# Patient Record
Sex: Male | Born: 1980 | Race: White | Hispanic: No | Marital: Married | Smoking: Never smoker
Health system: Southern US, Community
[De-identification: ages and names within clinical notes are randomized; demographics above are authoritative.]

## PROBLEM LIST (undated history)

## (undated) DIAGNOSIS — T4145XA Adverse effect of unspecified anesthetic, initial encounter: Secondary | ICD-10-CM

## (undated) DIAGNOSIS — T8859XA Other complications of anesthesia, initial encounter: Secondary | ICD-10-CM

## (undated) DIAGNOSIS — F988 Other specified behavioral and emotional disorders with onset usually occurring in childhood and adolescence: Secondary | ICD-10-CM

## (undated) DIAGNOSIS — I456 Pre-excitation syndrome: Secondary | ICD-10-CM

## (undated) DIAGNOSIS — R112 Nausea with vomiting, unspecified: Secondary | ICD-10-CM

## (undated) DIAGNOSIS — Z9889 Other specified postprocedural states: Secondary | ICD-10-CM

## (undated) HISTORY — PX: HERNIA REPAIR: SHX51

## (undated) HISTORY — PX: KNEE SURGERY: SHX244

## (undated) HISTORY — PX: CARDIAC SURGERY: SHX584

---

## 2008-08-14 ENCOUNTER — Ambulatory Visit: Payer: Self-pay | Admitting: Diagnostic Radiology

## 2008-08-14 ENCOUNTER — Emergency Department (HOSPITAL_BASED_OUTPATIENT_CLINIC_OR_DEPARTMENT_OTHER): Admission: EM | Admit: 2008-08-14 | Discharge: 2008-08-14 | Payer: Self-pay | Admitting: Emergency Medicine

## 2010-02-06 ENCOUNTER — Emergency Department (HOSPITAL_BASED_OUTPATIENT_CLINIC_OR_DEPARTMENT_OTHER): Admission: EM | Admit: 2010-02-06 | Discharge: 2010-02-06 | Payer: Self-pay | Admitting: Emergency Medicine

## 2012-12-20 ENCOUNTER — Emergency Department (INDEPENDENT_AMBULATORY_CARE_PROVIDER_SITE_OTHER): Payer: BC Managed Care – PPO

## 2012-12-20 ENCOUNTER — Encounter: Payer: Self-pay | Admitting: *Deleted

## 2012-12-20 ENCOUNTER — Emergency Department (INDEPENDENT_AMBULATORY_CARE_PROVIDER_SITE_OTHER)
Admission: EM | Admit: 2012-12-20 | Discharge: 2012-12-20 | Disposition: A | Discharge status: Home / Self Care | Payer: BC Managed Care – PPO | Source: Home / Self Care | Attending: Family Medicine | Admitting: Family Medicine

## 2012-12-20 DIAGNOSIS — Z9181 History of falling: Secondary | ICD-10-CM

## 2012-12-20 DIAGNOSIS — R0789 Other chest pain: Secondary | ICD-10-CM

## 2012-12-20 DIAGNOSIS — S20211A Contusion of right front wall of thorax, initial encounter: Secondary | ICD-10-CM

## 2012-12-20 DIAGNOSIS — S20219A Contusion of unspecified front wall of thorax, initial encounter: Secondary | ICD-10-CM

## 2012-12-20 HISTORY — DX: Pre-excitation syndrome: I45.6

## 2012-12-20 HISTORY — DX: Other specified behavioral and emotional disorders with onset usually occurring in childhood and adolescence: F98.8

## 2012-12-20 MED ORDER — CYCLOBENZAPRINE HCL 10 MG PO TABS: ORAL_TABLET | ORAL | Status: DC

## 2012-12-20 NOTE — ED Provider Notes (Signed)
CSN: [id number]     Arrival date & time 12/20/12  1048 History   First MD Initiated Contact with Patient 12/20/12 1150     Chief Complaint  Patient presents with  . Rib Injury      HPI Comments: Patient reports that he was throwing a [place] four days ago.  While jumping to reach the Frisbee he fell, landing on his right side.  He has had persistent pain in his right chest but no shortness of breath  Patient is a 32 y.o. male presenting with chest pain. The history is provided by the patient.  Chest Pain Pain location:  R chest Pain quality: sharp   Pain radiates to:  Does not radiate Pain radiates to the back: no   Pain severity:  Moderate Onset quality:  Sudden Duration:  4 days Timing:  Intermittent Progression:  Unchanged Chronicity:  New Context: trauma   Relieved by: pain medicine. Worsened by:  Coughing, deep breathing and certain positions Associated symptoms: no abdominal pain, no back pain, no cough, no diaphoresis, no fatigue, no fever, no palpitations and no shortness of breath     Past Medical History  Diagnosis Date  . Wolff-Parkinson-White syndrome     age 70  . ADD (attention deficit disorder)    Past Surgical History  Procedure Laterality Date  . Hernia repair    . Cardiac surgery    . Knee surgery Left    History reviewed. No pertinent family history. History  Substance Use Topics  . Smoking status: Never Smoker   . Smokeless tobacco: Never Used  . Alcohol Use: Yes    Review of Systems  Constitutional: Negative for fever, diaphoresis and fatigue.  Respiratory: Negative for cough and shortness of breath.   Cardiovascular: Positive for chest pain. Negative for palpitations.  Gastrointestinal: Negative for abdominal pain.  Musculoskeletal: Negative for back pain.  All other systems reviewed and are negative.    Allergies  Review of patient's allergies indicates no known allergies.  Home Medications   Current Outpatient Rx  Name  Route  Sig   Dispense  Refill  . cyclobenzaprine (FLEXERIL) 10 MG tablet      Take one tab by mouth at bedtime   5 tablet   0    BP 126/84  Pulse 60  Temp(Src) 98.2 F (36.8 C) (Oral)  Resp 18  Wt 225 lb (102.059 kg)  SpO2 100% Physical Exam  Nursing note and vitals reviewed. Constitutional: He is oriented to person, place, and time. He appears well-developed and well-nourished. No distress.  HENT:  Head: Atraumatic.  Eyes: Conjunctivae are normal. Pupils are equal, round, and reactive to light.  Neck: Normal range of motion.  Cardiovascular: Normal heart sounds.   Pulmonary/Chest: Breath sounds normal. He exhibits tenderness and bony tenderness. He exhibits no crepitus and no swelling.    Patient has distinct tenderness to palpation over the lower sternum extending to right medial costal margin as noted on diagram.  No swelling or ecchymosis.   Abdominal: There is no tenderness.  Neurological: He is alert and oriented to person, place, and time.  Skin: Skin is warm and dry. No rash noted.    ED Course  Procedures  none    Imaging Review Dg Ribs Unilateral W/chest Right  12/20/2012   *RADIOLOGY REPORT*  Clinical Data: Larey Seat injuring right anterior ribs several days ago with pain  RIGHT RIBS AND CHEST - 3+ VIEW  Comparison: None.  Findings: No active infiltrate or effusion  is seen.  Mediastinal contours appear normal.  The heart is within normal limits in size.  Right rib detail films show no acute right rib fracture.  The metallic marker over the area of pain overlies the costochondral junction.  IMPRESSION:  1.  No acute rib fracture is seen. 2.  No active lung disease.   Original Report Authenticated By: Dwyane Dee, M.D.    MDM   1. Contusion of ribs, right, initial encounter    Dispensed rib belt.  Rx for Flexeril at bedtime. Apply ice pack two or three times daily.  May continue Ibuprofen 200mg , 4 tabs every 8 hours with food.  Wear rib belt daytime until improved. Return for  worsening symptoms.  Discussed red flags.    Lattie Haw, MD 12/25/12 1025

## 2012-12-20 NOTE — ED Notes (Signed)
Pt c/o RT rib injury x 4 days ago, after jumping for a Frisbee and landing on his side at home. He has taken an old rx of pain medicine and flexeril. He also c/o some mid to upper back pain.

## 2013-08-20 ENCOUNTER — Encounter (HOSPITAL_COMMUNITY): Payer: Self-pay | Admitting: *Deleted

## 2013-08-20 ENCOUNTER — Other Ambulatory Visit: Payer: Self-pay | Admitting: Orthopedic Surgery

## 2013-08-20 ENCOUNTER — Encounter: Payer: Self-pay | Admitting: Emergency Medicine

## 2013-08-20 ENCOUNTER — Ambulatory Visit (HOSPITAL_COMMUNITY)
Admission: AD | Admit: 2013-08-20 | Discharge: 2013-08-20 | Disposition: A | Discharge status: Home / Self Care | Payer: BC Managed Care – PPO | Source: Ambulatory Visit | Attending: Orthopedic Surgery | Admitting: Orthopedic Surgery

## 2013-08-20 ENCOUNTER — Encounter (HOSPITAL_COMMUNITY): Payer: Self-pay | Admitting: Pharmacy Technician

## 2013-08-20 ENCOUNTER — Encounter (HOSPITAL_COMMUNITY): Payer: BC Managed Care – PPO | Admitting: Certified Registered Nurse Anesthetist

## 2013-08-20 ENCOUNTER — Encounter (HOSPITAL_COMMUNITY)
Admission: AD | Disposition: A | Discharge status: Home / Self Care | Payer: Self-pay | Source: Ambulatory Visit | Attending: Orthopedic Surgery

## 2013-08-20 ENCOUNTER — Ambulatory Visit (HOSPITAL_COMMUNITY): Payer: BC Managed Care – PPO | Admitting: Certified Registered Nurse Anesthetist

## 2013-08-20 ENCOUNTER — Emergency Department (INDEPENDENT_AMBULATORY_CARE_PROVIDER_SITE_OTHER)
Admission: EM | Admit: 2013-08-20 | Discharge: 2013-08-20 | Disposition: A | Discharge status: Home / Self Care | Payer: BC Managed Care – PPO | Source: Home / Self Care | Attending: Family Medicine | Admitting: Family Medicine

## 2013-08-20 DIAGNOSIS — Y929 Unspecified place or not applicable: Secondary | ICD-10-CM | POA: Insufficient documentation

## 2013-08-20 DIAGNOSIS — IMO0001 Reserved for inherently not codable concepts without codable children: Secondary | ICD-10-CM

## 2013-08-20 DIAGNOSIS — S66909A Unspecified injury of unspecified muscle, fascia and tendon at wrist and hand level, unspecified hand, initial encounter: Principal | ICD-10-CM

## 2013-08-20 DIAGNOSIS — S61209A Unspecified open wound of unspecified finger without damage to nail, initial encounter: Secondary | ICD-10-CM

## 2013-08-20 DIAGNOSIS — F988 Other specified behavioral and emotional disorders with onset usually occurring in childhood and adolescence: Secondary | ICD-10-CM | POA: Insufficient documentation

## 2013-08-20 DIAGNOSIS — S56129A Laceration of flexor muscle, fascia and tendon of unspecified finger at forearm level, initial encounter: Secondary | ICD-10-CM

## 2013-08-20 DIAGNOSIS — I456 Pre-excitation syndrome: Secondary | ICD-10-CM | POA: Insufficient documentation

## 2013-08-20 DIAGNOSIS — S61409A Unspecified open wound of unspecified hand, initial encounter: Secondary | ICD-10-CM | POA: Insufficient documentation

## 2013-08-20 DIAGNOSIS — Z23 Encounter for immunization: Secondary | ICD-10-CM

## 2013-08-20 DIAGNOSIS — X58XXXA Exposure to other specified factors, initial encounter: Secondary | ICD-10-CM | POA: Insufficient documentation

## 2013-08-20 HISTORY — DX: Adverse effect of unspecified anesthetic, initial encounter: T41.45XA

## 2013-08-20 HISTORY — PX: TENDON REPAIR: SHX5111

## 2013-08-20 HISTORY — DX: Nausea with vomiting, unspecified: R11.2

## 2013-08-20 HISTORY — DX: Other complications of anesthesia, initial encounter: T88.59XA

## 2013-08-20 HISTORY — DX: Other specified postprocedural states: Z98.890

## 2013-08-20 LAB — BASIC METABOLIC PANEL
BUN: 16 mg/dL (ref 6–23)
CO2: 22 mEq/L (ref 19–32)
Calcium: 9.9 mg/dL (ref 8.4–10.5)
Chloride: 104 mEq/L (ref 96–112)
Creatinine, Ser: 1.14 mg/dL (ref 0.50–1.35)
GFR calc Af Amer: >90 mL/min (ref >90)
GFR calc non Af Amer: 83 mL/min — ABNORMAL LOW (ref >90)
Glucose, Bld: 89 mg/dL (ref 70–99)
Potassium: 4.3 mEq/L (ref 3.7–5.3)
Sodium: 141 mEq/L (ref 137–147)

## 2013-08-20 LAB — CBC
HCT: 42.5 % (ref 39.0–52.0)
Hemoglobin: 15.1 g/dL (ref 13.0–17.0)
MCH: 32.7 pg (ref 26.0–34.0)
MCHC: 35.5 g/dL (ref 30.0–36.0)
MCV: 92 fL (ref 78.0–100.0)
Platelets: 223 10*3/uL (ref 150–400)
RBC: 4.62 MIL/uL (ref 4.22–5.81)
RDW: 12.2 % (ref 11.5–15.5)
WBC: 6.2 10*3/uL (ref 4.0–10.5)

## 2013-08-20 SURGERY — TENDON REPAIR
Anesthesia: General | Site: Hand | Laterality: Left

## 2013-08-20 MED ORDER — SUCCINYLCHOLINE CHLORIDE 20 MG/ML IJ SOLN
INTRAMUSCULAR | Status: DC | PRN
  Administered 2013-08-20: 100 mg via INTRAVENOUS

## 2013-08-20 MED ORDER — FENTANYL CITRATE 0.05 MG/ML IJ SOLN
INTRAMUSCULAR | Status: AC
  Filled 2013-08-20: qty 5

## 2013-08-20 MED ORDER — OXYCODONE HCL 5 MG/5ML PO SOLN: 5.0000 mg | Freq: Once | ORAL | Status: DC | PRN

## 2013-08-20 MED ORDER — LACTATED RINGERS IV SOLN
INTRAVENOUS | Status: DC
  Administered 2013-08-20: 16:00:00 via INTRAVENOUS

## 2013-08-20 MED ORDER — HYDROCODONE-ACETAMINOPHEN 5-325 MG PO TABS: ORAL_TABLET | ORAL | Status: DC

## 2013-08-20 MED ORDER — BUPIVACAINE HCL (PF) 0.25 % IJ SOLN
INTRAMUSCULAR | Status: AC
  Filled 2013-08-20: qty 30

## 2013-08-20 MED ORDER — PROMETHAZINE HCL 25 MG/ML IJ SOLN: 6.2500 mg | Freq: Once | INTRAMUSCULAR | Status: DC

## 2013-08-20 MED ORDER — LACTATED RINGERS IV SOLN
INTRAVENOUS | Status: DC | PRN
  Administered 2013-08-20: 16:00:00 via INTRAVENOUS

## 2013-08-20 MED ORDER — ONDANSETRON HCL 4 MG/2ML IJ SOLN
INTRAMUSCULAR | Status: DC | PRN
  Administered 2013-08-20: 4 mg via INTRAVENOUS

## 2013-08-20 MED ORDER — OXYCODONE HCL 5 MG PO TABS: 5.0000 mg | ORAL_TABLET | Freq: Once | ORAL | Status: DC | PRN

## 2013-08-20 MED ORDER — MIDAZOLAM HCL 5 MG/5ML IJ SOLN
INTRAMUSCULAR | Status: DC | PRN
  Administered 2013-08-20: 2 mg via INTRAVENOUS

## 2013-08-20 MED ORDER — CEFAZOLIN SODIUM-DEXTROSE 2-3 GM-% IV SOLR
INTRAVENOUS | Status: AC
  Administered 2013-08-20: 2 g via INTRAVENOUS
  Filled 2013-08-20: qty 50

## 2013-08-20 MED ORDER — FENTANYL CITRATE 0.05 MG/ML IJ SOLN
INTRAMUSCULAR | Status: DC | PRN
  Administered 2013-08-20: 150 ug via INTRAVENOUS
  Administered 2013-08-20: 100 ug via INTRAVENOUS

## 2013-08-20 MED ORDER — CEPHALEXIN 500 MG PO CAPS: 500.0000 mg | ORAL_CAPSULE | Freq: Four times a day (QID) | ORAL | Status: DC

## 2013-08-20 MED ORDER — OXYCODONE-ACETAMINOPHEN 5-325 MG PO TABS: 1.0000 | ORAL_TABLET | ORAL | Status: DC | PRN

## 2013-08-20 MED ORDER — LIDOCAINE HCL (CARDIAC) 20 MG/ML IV SOLN
INTRAVENOUS | Status: DC | PRN
  Administered 2013-08-20: 60 mg via INTRAVENOUS

## 2013-08-20 MED ORDER — MIDAZOLAM HCL 2 MG/2ML IJ SOLN
INTRAMUSCULAR | Status: AC
  Filled 2013-08-20: qty 2

## 2013-08-20 MED ORDER — 0.9 % SODIUM CHLORIDE (POUR BTL) OPTIME
TOPICAL | Status: DC | PRN
  Administered 2013-08-20: 1000 mL

## 2013-08-20 MED ORDER — ONDANSETRON HCL 4 MG/2ML IJ SOLN
INTRAMUSCULAR | Status: AC
  Filled 2013-08-20: qty 2

## 2013-08-20 MED ORDER — PROPOFOL 10 MG/ML IV BOLUS
INTRAVENOUS | Status: AC
  Filled 2013-08-20: qty 20

## 2013-08-20 MED ORDER — PROPOFOL 10 MG/ML IV BOLUS
INTRAVENOUS | Status: DC | PRN
  Administered 2013-08-20: 200 mg via INTRAVENOUS

## 2013-08-20 MED ORDER — BUPIVACAINE HCL 0.25 % IJ SOLN
INTRAMUSCULAR | Status: DC | PRN
  Administered 2013-08-20: 3 mL

## 2013-08-20 MED ORDER — HYDROMORPHONE HCL PF 1 MG/ML IJ SOLN: 0.2500 mg | INTRAMUSCULAR | Status: DC | PRN

## 2013-08-20 SURGICAL SUPPLY — 46 items
BANDAGE ELASTIC 4 VELCRO ST LF (GAUZE/BANDAGES/DRESSINGS) IMPLANT
BANDAGE GAUZE ELAST BULKY 4 IN (GAUZE/BANDAGES/DRESSINGS) IMPLANT
BNDG CMPR 9X4 STRL LF SNTH (GAUZE/BANDAGES/DRESSINGS) ×1
BNDG COHESIVE 1X5 TAN STRL LF (GAUZE/BANDAGES/DRESSINGS) ×3 IMPLANT
BNDG ESMARK 4X9 LF (GAUZE/BANDAGES/DRESSINGS) ×3 IMPLANT
CLOSURE WOUND 1/2 X4 (GAUZE/BANDAGES/DRESSINGS)
CORDS BIPOLAR (ELECTRODE) ×3 IMPLANT
COVER SURGICAL LIGHT HANDLE (MISCELLANEOUS) ×3 IMPLANT
CUFF TOURNIQUET SINGLE 18IN (TOURNIQUET CUFF) IMPLANT
CUFF TOURNIQUET SINGLE 24IN (TOURNIQUET CUFF) IMPLANT
DECANTER SPIKE VIAL GLASS SM (MISCELLANEOUS) IMPLANT
DRAPE OEC MINIVIEW 54X84 (DRAPES) IMPLANT
DRAPE SURG 17X23 STRL (DRAPES) ×3 IMPLANT
DURAPREP 26ML APPLICATOR (WOUND CARE) ×3 IMPLANT
GAUZE XEROFORM 1X8 LF (GAUZE/BANDAGES/DRESSINGS) ×3 IMPLANT
GLOVE BIO SURGEON STRL SZ8.5 (GLOVE) ×3 IMPLANT
GOWN STRL REUS W/ TWL LRG LVL3 (GOWN DISPOSABLE) ×1 IMPLANT
GOWN STRL REUS W/ TWL XL LVL3 (GOWN DISPOSABLE) ×1 IMPLANT
GOWN STRL REUS W/TWL LRG LVL3 (GOWN DISPOSABLE) ×3
GOWN STRL REUS W/TWL XL LVL3 (GOWN DISPOSABLE) ×3
KIT BASIN OR (CUSTOM PROCEDURE TRAY) ×3 IMPLANT
KIT ROOM TURNOVER OR (KITS) ×3 IMPLANT
MANIFOLD NEPTUNE II (INSTRUMENTS) ×3 IMPLANT
NEEDLE HYPO 25GX1X1/2 BEV (NEEDLE) IMPLANT
NS IRRIG 1000ML POUR BTL (IV SOLUTION) ×3 IMPLANT
PACK ORTHO EXTREMITY (CUSTOM PROCEDURE TRAY) ×3 IMPLANT
PAD ARMBOARD 7.5X6 YLW CONV (MISCELLANEOUS) ×6 IMPLANT
PAD CAST 4YDX4 CTTN HI CHSV (CAST SUPPLIES) IMPLANT
PADDING CAST COTTON 4X4 STRL (CAST SUPPLIES)
SPONGE GAUZE 4X4 12PLY (GAUZE/BANDAGES/DRESSINGS) IMPLANT
STRIP CLOSURE SKIN 1/2X4 (GAUZE/BANDAGES/DRESSINGS) IMPLANT
SUT ETHIBOND 3-0 V-5 (SUTURE) IMPLANT
SUT ETHILON 5 0 PS 2 18 (SUTURE) IMPLANT
SUT PROLENE 3 0 PS 2 (SUTURE) IMPLANT
SUT SILK 4 0 PS 2 (SUTURE) IMPLANT
SUT VIC AB 3-0 FS2 27 (SUTURE) IMPLANT
SUT VIC AB 4-0 P-3 18X BRD (SUTURE) IMPLANT
SUT VIC AB 4-0 P3 18 (SUTURE)
SUT VICRYL RAPIDE 4/0 PS 2 (SUTURE) ×3 IMPLANT
SYR CONTROL 10ML LL (SYRINGE) IMPLANT
TOWEL OR 17X24 6PK STRL BLUE (TOWEL DISPOSABLE) ×3 IMPLANT
TOWEL OR 17X26 10 PK STRL BLUE (TOWEL DISPOSABLE) ×3 IMPLANT
TUBE CONNECTING 12'X1/4 (SUCTIONS)
TUBE CONNECTING 12X1/4 (SUCTIONS) IMPLANT
UNDERPAD 30X30 INCONTINENT (UNDERPADS AND DIAPERS) ×3 IMPLANT
WATER STERILE IRR 1000ML POUR (IV SOLUTION) ×3 IMPLANT

## 2013-08-20 NOTE — H&P (Signed)
Randall Wilson is an 33 y.o. male.   Chief Complaint: left index volar wound with exposed tendon HPI: as above with volar puncture injury on 5/17   Past Medical History  Diagnosis Date  . Wolff-Parkinson-White syndrome     age 89  . ADD (attention deficit disorder)     Past Surgical History  Procedure Laterality Date  . Hernia repair    . Cardiac surgery    . Knee surgery Left     Family History  Problem Relation Age of Onset  . Hyperlipidemia Mother    Social History:  reports that he has never smoked. He has never used smokeless tobacco. He reports that he drinks alcohol. He reports that he does not use illicit drugs.  Allergies: Not on File  No prescriptions prior to admission    No results found for this or any previous visit (from the past 48 hour(s)). No results found.  Review of Systems  All other systems reviewed and are negative.   There were no vitals taken for this visit. Physical Exam  Constitutional: He is oriented to person, place, and time. He appears well-developed and well-nourished.  HENT:  Head: Normocephalic and atraumatic.  Cardiovascular: Normal rate.   Respiratory: Effort normal.  Musculoskeletal:  Left index volar lac over middle phalanx with exposed tendon  Neurological: He is alert and oriented to person, place, and time.  Skin: Skin is warm.  Psychiatric: He has a normal mood and affect. His behavior is normal. Judgment and thought content normal.     Assessment/Plan As above   Plan explore and repair as needed  Randall ShoresMatthew A Nataliya Wilson 08/20/2013, 2:52 PM

## 2013-08-20 NOTE — ED Provider Notes (Signed)
CSN: [id number]     Arrival date & time 08/20/13  1011 History   First MD Initiated Contact with Patient 08/20/13 1021     Chief Complaint  Patient presents with  . Finger Injury      HPI Comments: Patient accidentally penetrated his left second finger with a rotating Phillips screwdriver bit in his electric drill prior to arrival.  He complains of pain radiating to his left forearm.  He is not sure about his last Tdap.  He washed the wound with water prior to arrival  Patient is a 33 y.o. male presenting with hand pain. The history is provided by the patient.  Hand Pain This is a new problem. The current episode started less than 1 hour ago. The problem occurs constantly. The problem has been gradually worsening. Associated symptoms comments: Left arm pain. Exacerbated by: flexion of finger. He has tried nothing for the symptoms.    Past Medical History  Diagnosis Date  . Wolff-Parkinson-White syndrome     age 33  . ADD (attention deficit disorder)   . Complication of anesthesia   . PONV (postoperative nausea and vomiting)    Past Surgical History  Procedure Laterality Date  . Hernia repair    . Cardiac surgery    . Knee surgery Left   . Tendon repair Left 08/20/2013    Procedure: Explore Left Index FInger and tendon. Repair of laceration and irrigation and debridment.;  Surgeon: Marlowe ShoresMatthew A Weingold, MD;  Location: [facility] OR;  Service: Orthopedics;  Laterality: Left;   Family History  Problem Relation Age of Onset  . Hyperlipidemia Mother    History  Substance Use Topics  . Smoking status: Never Smoker   . Smokeless tobacco: Never Used  . Alcohol Use: Yes    Review of Systems  All other systems reviewed and are negative.   Allergies  Review of patient's allergies indicates no known allergies.  Home Medications   Prior to Admission medications   Medication Sig Start Date End Date Taking? Authorizing Provider  cyclobenzaprine (FLEXERIL) 10 MG tablet Take one tab by mouth  at bedtime 12/20/12   Lattie HawStephen A Lancelot Alyea, MD   BP 155/97  Pulse 85  Temp(Src) 98.1 F (36.7 C) (Oral)  Ht 6\' 2"  (1.88 m)  Wt 236 lb (107.049 kg)  BMI 30.29 kg/m2  SpO2 100% Physical Exam  Nursing note and vitals reviewed. Constitutional: He is oriented to person, place, and time. He appears well-developed and well-nourished. No distress.  HENT:  Head: Atraumatic.  Eyes: Conjunctivae are normal. Pupils are equal, round, and reactive to light.  Musculoskeletal:       Left hand: He exhibits decreased range of motion, tenderness, bony tenderness, laceration and swelling. He exhibits normal two-point discrimination, normal capillary refill and no deformity. Normal sensation noted.       Hands: On the palmar surface of the left second finger middle phalanx is an 8mm dia wound with what appears to be a small fragment of tendon protruding from the wound.  Patient is able to slightly flex and extend the finger at DIP, PIP, and MCP joints.  Distal neurovascular function is intact.   Neurological: He is oriented to person, place, and time.  Skin: Skin is warm and dry.    ED Course  Procedures  none      MDM   1. Puncture wound of second finger of left hand with tendon involvement     Recommend evaluation and treatment by hand surgeon.  Patient and wife prefer to be evaluated by Dr. Sanjuana [facility]Benjamin Graves [facility]. Discussed with Dr. Luiz BlareGraves, who recommended bandage, splint, and beginning Keflex.  Rx also given for Vicodin.  Sling applied. Patient will see Dr. Luiz BlareGraves on 08/23/13.    Lattie HawStephen A Gissel Keilman, MD 08/21/13 2231

## 2013-08-20 NOTE — Anesthesia Postprocedure Evaluation (Signed)
  Anesthesia Post-op Note  Patient: Randall Wilson  Procedure(s) Performed: Procedure(s): Explore Left Index FInger and tendon. Repair of laceration and irrigation and debridment. (Left)  Patient Location: PACU  Anesthesia Type:General  Level of Consciousness: awake and alert   Airway and Oxygen Therapy: Patient Spontanous Breathing  Post-op Pain: none  Post-op Assessment: Post-op Vital signs reviewed, Patient's Cardiovascular Status Stable and Respiratory Function Stable  Post-op Vital Signs: Reviewed  Filed Vitals:   08/20/13 1945  BP: 155/92  Pulse: 88  Temp:   Resp: 22    Complications: No apparent anesthesia complications

## 2013-08-20 NOTE — Op Note (Signed)
See note [phone]

## 2013-08-20 NOTE — ED Notes (Signed)
Left index finger injury yesterday with a drill bit, tendon is sticking out, send int hand surgeon, administered TDAP

## 2013-08-20 NOTE — Anesthesia Preprocedure Evaluation (Addendum)
Anesthesia Evaluation  Patient identified by MRN, date of birth, ID band Patient awake    Reviewed: Allergy & Precautions, H&P , NPO status , Patient's Chart, lab work & pertinent test results  History of Anesthesia Complications (+) PONV and history of anesthetic complications  Airway Mallampati: II TM Distance: >3 FB Neck ROM: Full    Dental no notable dental hx. (+) Dental Advisory Given, Teeth Intact   Pulmonary neg pulmonary ROS,  breath sounds clear to auscultation  Pulmonary exam normal       Cardiovascular negative cardio ROS  + dysrhythmias Rhythm:Regular Rate:Normal     Neuro/Psych negative neurological ROS  negative psych ROS   GI/Hepatic negative GI ROS, Neg liver ROS,   Endo/Other  negative endocrine ROS  Renal/GU negative Renal ROS  negative genitourinary   Musculoskeletal   Abdominal   Peds  (+) ATTENTION DEFICIT DISORDER WITHOUT HYPERACTIVITY Hematology negative hematology ROS (+)   Anesthesia Other Findings   Reproductive/Obstetrics negative OB ROS                          Anesthesia Physical Anesthesia Plan  ASA: II  Anesthesia Plan: General   Post-op Pain Management:    Induction: Intravenous  Airway Management Planned: LMA and Oral ETT  Additional Equipment:   Intra-op Plan:   Post-operative Plan: Extubation in OR  Informed Consent: I have reviewed the patients History and Physical, chart, labs and discussed the procedure including the risks, benefits and alternatives for the proposed anesthesia with the patient or authorized representative who has indicated his/her understanding and acceptance.   Dental advisory given  Plan Discussed with: CRNA, Anesthesiologist and Surgeon  Anesthesia Plan Comments:         Anesthesia Quick Evaluation

## 2013-08-20 NOTE — Discharge Instructions (Signed)
Change bandage daily.  Wear finger splint and sling.  May take Ibuprofen 200mg , 4 tabs every 8 hours with food.

## 2013-08-20 NOTE — Transfer of Care (Signed)
Immediate Anesthesia Transfer of Care Note  Patient: Randall Wilson  Procedure(s) Performed: Procedure(s): Explore Left Index FInger and tendon. Repair of laceration and irrigation and debridment. (Left)  Patient Location: PACU  Anesthesia Type:General  Level of Consciousness: awake, alert , oriented and patient cooperative  Airway & Oxygen Therapy: Patient Spontanous Breathing and Patient connected to nasal cannula oxygen  Post-op Assessment: Report given to PACU RN, Post -op Vital signs reviewed and stable and Patient moving all extremities  Post vital signs: Reviewed and stable  Complications: No apparent anesthesia complications

## 2013-08-20 NOTE — Anesthesia Procedure Notes (Signed)
Procedure Name: Intubation Date/Time: 08/20/2013 6:57 PM Performed by: Jerilee HohMUMM, Ketan Renz N Pre-anesthesia Checklist: Emergency Drugs available, Patient identified, Patient being monitored and Suction available Patient Re-evaluated:Patient Re-evaluated prior to inductionOxygen Delivery Method: Circle system utilized Preoxygenation: Pre-oxygenation with 100% oxygen Intubation Type: IV induction, Rapid sequence and Cricoid Pressure applied Laryngoscope Size: Mac and 4 Grade View: Grade I Tube type: Oral Tube size: 7.5 mm Number of attempts: 1 Airway Equipment and Method: Stylet Placement Confirmation: ETT inserted through vocal cords under direct vision,  positive ETCO2 and breath sounds checked- equal and bilateral Secured at: 22 cm Tube secured with: Tape Dental Injury: Teeth and Oropharynx as per pre-operative assessment

## 2013-08-21 ENCOUNTER — Encounter (HOSPITAL_COMMUNITY): Payer: Self-pay | Admitting: Orthopedic Surgery

## 2013-08-21 NOTE — Op Note (Signed)
[name]:  [name], [name]            ACCOUNT NO.:  [id number]  MEDICAL RECORD NO.:  [id number]  LOCATION:  [facility]                         FACILITY:  [facility]  PHYSICIAN:  Artist PaisMatthew A. Xzavior Reinig, M.D.DATE OF BIRTH:  11/27/1980  DATE OF PROCEDURE:  08/20/2013 DATE OF DISCHARGE:  08/20/2013                              OPERATIVE REPORT   PREOPERATIVE DIAGNOSIS:  Deep laceration, palmar aspect, left index finger, middle phalanx.  POSTOPERATIVE DIAGNOSIS:  Deep laceration, palmar aspect, left index finger, middle phalanx.  OPERATION:  Deep laceration, palmar aspect, left index finger, middle phalanx with debridement of flexor digitorum profundus tendon and flexor tendon sheath.  SURGEON:  Artist PaisMatthew A. Mina MarbleWeingold, M.D.  ASSISTANT:  None.  ANESTHESIA:  General.  COMPLICATIONS:  No complications.  DRAINS:  No drains.  DESCRIPTION OF PROCEDURE:  The patient was taken to the operating suite. After the induction of adequate general anesthesia, left upper extremity was prepped and draped in usual sterile fashion.  Esmarch was used to exsanguinate the limb.  Tourniquet was inflated 275 mmHg.  At this point in time, a puncture wound over the middle phalanx of the left index finger was extended in a Brunner fashion going distal ulnar to proximal radial.  Dissection was carried down.  Neurovascular bones were identified and retracted.  There was a violation of the flexor tendon sheath between the edge of the A4 and A5 pulleys and a small bit of profundus tendon had been damaged.  This was carefully debrided using a 15-blade down to a smooth edge.  The tendon then passed under the pulleys with no difficulty.  The wound was irrigated and was loosely closed with 4-0 Vicryl Rapide, Xeroform, 4x4s, and Coban wrap was applied.  The patient tolerated the procedure well and went to the recovery room in a stable fashion.     Artist PaisMatthew A. Mina MarbleWeingold, M.D.     MAW/MEDQ  D:  08/20/2013  T:  08/21/2013  Job:   [id number]

## 2015-12-04 ENCOUNTER — Ambulatory Visit (INDEPENDENT_AMBULATORY_CARE_PROVIDER_SITE_OTHER): Payer: Managed Care, Other (non HMO) | Admitting: Family Medicine

## 2015-12-04 ENCOUNTER — Encounter: Payer: Self-pay | Admitting: Family Medicine

## 2015-12-04 DIAGNOSIS — F988 Other specified behavioral and emotional disorders with onset usually occurring in childhood and adolescence: Secondary | ICD-10-CM | POA: Insufficient documentation

## 2015-12-04 DIAGNOSIS — F909 Attention-deficit hyperactivity disorder, unspecified type: Secondary | ICD-10-CM | POA: Diagnosis not present

## 2015-12-04 NOTE — Patient Instructions (Signed)
Be sure to give our office 3-5 days notice in regards to refilling your rx.

## 2015-12-04 NOTE — Progress Notes (Signed)
Chief Complaint  Patient presents with  . New Patient (Initial Visit)       New Patient Visit SUBJECTIVE: HPI: Randall Wilson is an 35 y.o.male who is being seen for establishing care.  The patient was previously seen at [facility].  Health maintenance history: Routine blood work: 3 mo ago HIV screening: Never been screened; not interested Tetanus: 2 years ago   ADD Takes Adderall 20 mg BID, doing well on current dose. He uses it when he works. He was diagnosed when he was a kid, but his parents were against medication. He started taking it in his mid-20's when he got his own insurance.  Denies facial tics, weight loss/appetite change or insomnia.    WPW  Ablation when he was 14. He has been doing well and is no longer following with a cardiologist.  No Known Allergies  Past Medical History:  Diagnosis Date  . ADD (attention deficit disorder)   . Complication of anesthesia   . PONV (postoperative nausea and vomiting)   . Wolff-Parkinson-White syndrome    age 58   Past Surgical History:  Procedure Laterality Date  . CARDIAC SURGERY    . HERNIA REPAIR    . KNEE SURGERY Left   . TENDON REPAIR Left 08/20/2013   Procedure: Explore Left Index FInger and tendon. Repair of laceration and irrigation and debridment.;  Surgeon: Marlowe Shores, MD;  Location: [facility] OR;  Service: Orthopedics;  Laterality: Left;   Social History   Social History  . Marital status: Married   Social History Main Topics  . Smoking status: Never Smoker  . Smokeless tobacco: Never Used  . Alcohol use Yes  . Drug use: No   Family History  Problem Relation Age of Onset  . Hyperlipidemia Mother      Current Outpatient Prescriptions:  .  amphetamine-dextroamphetamine (ADDERALL) 20 MG tablet, Take 20 mg by mouth 2 (two) times daily., Disp: , Rfl:  .  Multiple Vitamin (MULTIVITAMIN WITH MINERALS) TABS tablet, Take 1 tablet by mouth daily., Disp: , Rfl:   ROS Neuro: Denies facial tics  Const:  Denies weight loss  Psych: Denies inattention or insomnia  Cardiovascular: Denies palpitations   OBJECTIVE: BP 129/87 (BP Location: Left Arm, Patient Position: Sitting, Cuff Size: Normal)   Pulse 70   Temp 98.3 F (36.8 C) (Oral)   Resp 16   Ht 6\' 2"  (1.88 m)   Wt 239 lb 6.4 oz (108.6 kg)   SpO2 100%   BMI 30.74 kg/m   Constitutional: -  VS reviewed -  Well developed, well nourished, appears stated age -  No apparent distress  Psychiatric: -  Oriented to person, place, and time -  Memory intact -  Affect and mood normal -  Fluent conversation, good eye contact -  Judgment and insight age appropriate  Eye: -  Conjunctivae clear, no discharge -  Pupils symmetric, round, reactive to light  ENMT: -  Ears are patent b/l without erythema or discharge. TM's are shiny and clear b/l without evidence of effusion or infection. -  Oral mucosa without lesions, tongue and uvula midline    Tonsils not enlarged, no erythema, no exudate, trachea midline    Pharynx moist, no lesions, no erythema  Neck: -  No gross swelling, no palpable masses -  Thyroid midline, not enlarged, mobile, no palpable masses  Cardiovascular: -  RRR, no murmurs -  No LE edema  Respiratory: -  Normal respiratory effort, no accessory muscle use,  no retraction -  Breath sounds equal, no wheezes, no ronchi, no crackles  Neurological:  -  CN II - XII grossly intact -  No tics -  Sensation grossly intact to light touch, equal bilaterally  Musculoskeletal: -  No muscle group atrophy or asymmetry -  Gait normal  Skin: -  No significant lesion on inspection -  Warm and dry to palpation   ASSESSMENT/PLAN: ADD (attention deficit disorder)  Patient instructed to sign release of records form for his previous PCP.  Will do 3 mo refills.  I am OK with doing rechecks every 6-12 mo.  Patient should return in 11-12 mo for ADD check. The patient voiced understanding and agreement to the plan.   Jilda RocheNicholas Paul LakeviewWendling

## 2015-12-04 NOTE — Progress Notes (Signed)
Pre visit review using our clinic review tool, if applicable. No additional management support is needed unless otherwise documented below in the visit note. 

## 2015-12-11 ENCOUNTER — Telehealth: Payer: Self-pay

## 2015-12-11 NOTE — Telephone Encounter (Signed)
Medical Records [facility]. Records forwarded to PCP.

## 2016-01-16 ENCOUNTER — Telehealth: Payer: Self-pay | Admitting: Family Medicine

## 2016-01-16 NOTE — Telephone Encounter (Signed)
°  Relationship to patient: Self  Can be reached: [phone]    Reason for call: Refill amphetamine-dextroamphetamine (ADDERALL) 20 MG tablet [[id number]][[id number]]

## 2016-01-16 NOTE — Telephone Encounter (Signed)
Adderall 20mg -Take 1 tablet by mouth 2 times daily. Last refill:This is a historical medication-we have never filled it. Last OV:12/04/15 UDS:CSC signed-no UDS given Please advise.//AB/CMA

## 2016-01-16 NOTE — Telephone Encounter (Signed)
Called and spoke with the pt and informed him that Dr. Carmelia RollerWendling is not in the office today,but he will be back in the office on Monday.  Informed the pt that I can get another provider to address the refill request.  Pt stated that is have enough and he can wait until Dr. Carmelia RollerWendling cames back on Monday.//AB/CMA

## 2016-01-19 ENCOUNTER — Other Ambulatory Visit: Payer: Self-pay | Admitting: Family Medicine

## 2016-01-19 MED ORDER — AMPHETAMINE-DEXTROAMPHETAMINE 20 MG PO TABS: 20.0000 mg | ORAL_TABLET | Freq: Two times a day (BID) | ORAL | 0 refills | Status: DC

## 2016-01-19 NOTE — Telephone Encounter (Signed)
[facility] 8[facility]18[facility] @ [phone]) informing the pt that prescription refill request has been approved and is ready for him to pick up.  Prescription will be placed up front.  Pt will need an UDS.//AB/CMA

## 2016-01-19 NOTE — Telephone Encounter (Signed)
OK. Have him leave a urine sample when he picks up his rx please. TY.

## 2016-01-20 ENCOUNTER — Encounter: Payer: Self-pay | Admitting: Family Medicine

## 2016-02-11 ENCOUNTER — Telehealth: Payer: Self-pay | Admitting: Family Medicine

## 2016-02-11 ENCOUNTER — Ambulatory Visit (INDEPENDENT_AMBULATORY_CARE_PROVIDER_SITE_OTHER): Payer: Managed Care, Other (non HMO) | Admitting: Family Medicine

## 2016-02-11 ENCOUNTER — Encounter: Payer: Self-pay | Admitting: Family Medicine

## 2016-02-11 ENCOUNTER — Ambulatory Visit (HOSPITAL_BASED_OUTPATIENT_CLINIC_OR_DEPARTMENT_OTHER)
Admission: RE | Admit: 2016-02-11 | Discharge: 2016-02-11 | Disposition: A | Discharge status: Home / Self Care | Payer: Managed Care, Other (non HMO) | Source: Ambulatory Visit | Attending: Family Medicine | Admitting: Family Medicine

## 2016-02-11 VITALS — BP 130/76 | HR 78 | Temp 97.8°F | Ht 74.0 in | Wt 254.2 lb

## 2016-02-11 DIAGNOSIS — M25562 Pain in left knee: Secondary | ICD-10-CM | POA: Diagnosis not present

## 2016-02-11 DIAGNOSIS — G5702 Lesion of sciatic nerve, left lower limb: Secondary | ICD-10-CM

## 2016-02-11 NOTE — Telephone Encounter (Signed)
Patient was seen today and stated that he was told some different things to do for his hip but he forgot... He would like a call back to get that information. He stated he also did not get the AVS. Please advise.    Patient phone: [phone]

## 2016-02-11 NOTE — Progress Notes (Signed)
Pre visit review using our clinic review tool, if applicable. No additional management support is needed unless otherwise documented below in the visit note. 

## 2016-02-11 NOTE — Patient Instructions (Addendum)
Piriformis Syndrome With Rehab Piriformis syndrome is a condition the affects the nervous system in the area of the hip, and is characterized by pain and possibly a loss of feeling in the backside (posterior) thigh that may extend down the entire length of the leg. The symptoms are caused by an increase in pressure on the sciatic nerve by the piriformis muscle, which is on the back of the hip and is responsible for externally rotating the hip. The sciatic nerve and its branches connect to much of the leg. Normally the sciatic nerve runs between the piriformis muscle and other muscles. However, in certain individuals the nerve runs through the muscle, which causes an increase in pressure on the nerve and results in the symptoms of piriformis syndrome. SYMPTOMS   Pain, tingling, numbness, or burning in the back of the thigh that may also extend down the entire leg.  Occasionally, tenderness in the buttock.  Loss of function of the leg.  Pain that worsens when using the piriformis muscle (running, jumping, or stairs).  Pain that increases with prolonged sitting.  Pain that is lessened by lying flat on the back. CAUSES   Piriformis syndrome is the result of an increase in pressure placed on the sciatic nerve. Oftentimes, piriformis syndrome is an overuse injury.  Stress placed on the nerve from a sudden increase in the intensity, frequency, or duration of training.  Compensation of other extremity injuries. RISK INCREASES WITH:  Sports that involve the piriformis muscle (running, walking, or jumping).  You are born with (congenital) a defect in which the sciatic nerve passes through the muscle. PREVENTION  Warm up and stretch properly before activity.  Allow for adequate recovery between workouts.  Maintain physical fitness:  Strength, flexibility, and endurance.  Cardiovascular fitness. PROGNOSIS  If treated properly, the symptoms of piriformis syndrome usually resolve in 2 to 6  weeks. RELATED COMPLICATIONS   Persistent and possibly permanent pain and numbness in the lower extremity.  Weakness of the extremity that may progress to disability and inability to compete. TREATMENT  The most effective treatment for piriformis syndrome is rest from any activities that aggravate the symptoms. Ice and pain medication may help reduce pain and inflammation. The use of strengthening and stretching exercises may help reduce pain with activity. These exercises may be performed at home or with a therapist. A referral to a therapist may be given for further evaluation and treatment, such as ultrasound. Corticosteroid injections may be given to reduce inflammation that is causing pressure to be placed on the sciatic nerve. If nonsurgical (conservative) treatment is unsuccessful, then surgery may be recommended.  MEDICATION   If pain medication is necessary, then nonsteroidal anti-inflammatory medications, such as aspirin and ibuprofen, or other minor pain relievers, such as acetaminophen, are often recommended.  Do not take pain medication for 7 days before surgery.  Prescription pain relievers may be given if deemed necessary by your caregiver. Use only as directed and only as much as you need.  Corticosteroid injections may be given by your caregiver. These injections should be reserved for the most serious cases, because they may only be given a certain number of times. HEAT AND COLD:   Cold treatment (icing) relieves pain and reduces inflammation. Cold treatment should be applied for 10 to 15 minutes every 2 to 3 hours for inflammation and pain and immediately after any activity that aggravates your symptoms. Use ice packs or massage the area with a piece of ice (ice massage).  Heat   treatment may be used prior to performing the stretching and strengthening activities prescribed by your caregiver, physical therapist, or athletic trainer. Use a heat pack or soak the injury in warm  water. SEEK IMMEDIATE MEDICAL CARE IF:  Treatment seems to offer no benefit, or the condition worsens.  Any medications produce adverse side effects. EXERCISES RANGE OF MOTION (ROM) AND STRETCHING EXERCISES - Piriformis Syndrome These exercises may help you when beginning to rehabilitate your injury. Your symptoms may resolve with or without further involvement from your physician, physical therapist, or athletic trainer. While completing these exercises, remember:   Restoring tissue flexibility helps normal motion to return to the joints. This allows healthier, less painful movement and activity.  An effective stretch should be held for at least 30 seconds.  A stretch should never be painful. You should only feel a gentle lengthening or release in the stretched tissue. STRETCH - Hip Rotators  Lie on your back on a firm surface. Grasp your right / left knee with your right / left hand and your ankle with your opposite hand.  Keeping your hips and shoulders firmly planted, gently pull your right / left knee and rotate your lower leg toward your opposite shoulder until you feel a stretch in your buttocks.  Hold this stretch for __________ seconds. Repeat this stretch __________ times. Complete this stretch __________ times per day. STRETCH - Iliotibial Band  On the floor or bed, lie on your side so your right / left leg is on top. Bend your knee and grab your ankle.  Slowly bring your knee back so that your thigh is in line with your trunk. Keep your heel at your buttocks and gently arch your back so your head, shoulders, and hips line up.  Slowly lower your leg so that your knee approaches the floor/bed until you feel a gentle stretch on the outside of your right / left thigh. If you do not feel a stretch and your knee will not fall farther, place the heel of your opposite foot on top of your knee and pull your thigh down farther.  Hold this stretch for __________ seconds. Repeat  __________ times. Complete __________ times per day. STRENGTHENING EXERCISES - Piriformis Syndrome  These are some of the caregiver again or until your symptoms are resolved. Remember:   Strong muscles with good endurance tolerate stress better.  Do the exercises as initially prescribed by your caregiver. Progress slowly with each exercise, gradually increasing the number of repetitions and weight used under their guidance. STRENGTH - Hip Abductors, Straight Leg Raises Be aware of your form throughout the entire exercise so that you exercise the correct muscles. Sloppy form means that you are not strengthening the correct muscles.  Lie on your side so that your head, shoulders, knee, and hip line up. You may bend your lower knee to help maintain your balance. Your right / left leg should be on top.  Roll your hips slightly forward, so that your hips are stacked directly over each other and your right / left knee is facing forward.  Lift your top leg up 4-6 inches, leading with your heel. Be sure that your foot does not drift forward or that your knee does not roll toward the ceiling.  Hold this position for __________ seconds. You should feel the muscles in your outer hip lifting (you may not notice this until your leg begins to tire).  Slowly lower your leg to the starting position. Allow the muscles to fully   relax before beginning the next repetition. Repeat __________ times. Complete this exercise __________ times per day.  STRENGTH - Hip Abductors, Quadruped  On a firm, lightly padded surface, position yourself on your hands and knees. Your hands should be directly below your shoulders and your knees should be directly below your hips.  Keeping your right / left knee bent, lift your leg out to the side. Keep your legs level and in line with your shoulders.  Position yourself on your hands and knees.  Hold for __________ seconds.  Keeping your trunk steady and your hips level, slowly  lower your leg to the starting position. Repeat __________ times. Complete this exercise __________ times per day.  STRENGTH - Hip Abductors, Standing  Tie one end of a rubber exercise band/tubing to a secure surface (table, pole) and tie a loop at the other end.  Place the loop around your right / left ankle. Keeping your ankle with the band directly opposite of the secured end, step away until there is tension in the tube/band.  Hold onto a chair as needed for balance.  Keeping your back upright, your shoulders over your hips, and your toes pointing forward, lift your right / left leg out to your side. Be sure to lift your leg with your hip muscles. Do not "throw" your leg or tip your body to lift your leg.  Slowly and with control, return to the starting position. Repeat exercise __________ times. Complete this exercise __________ times per day.    This information is not intended to replace advice given to you by your health care provider. Make sure you discuss any questions you have with your health care provider.   Document Released: 03/22/2005 Document Revised: 08/06/2014 Document Reviewed: 07/04/2008 Elsevier Interactive Patient Education 2016 [employer]vier Inc.  

## 2016-02-11 NOTE — Telephone Encounter (Signed)
Please advise.//AB/CMA 
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
## 2016-02-12 NOTE — Telephone Encounter (Signed)
Piriformis syndrome stretches are in his AVS. Are we able to mail it to him or have him come in to get them? Thanks.

## 2016-02-12 NOTE — Telephone Encounter (Signed)
Yes we can. I will print and mail to the patient. TL/CMA

## 2016-02-17 ENCOUNTER — Telehealth: Payer: Self-pay | Admitting: Family Medicine

## 2016-02-17 NOTE — Telephone Encounter (Signed)
Patient is calling regarding his imaging results. He would like a call ASAP as he states it has been a little while since he had this done. Please advise.   Patient phone: [phone]

## 2016-02-18 NOTE — Telephone Encounter (Signed)
It was normal

## 2016-02-18 NOTE — Telephone Encounter (Signed)
Please advise patient had a knee xray on 02/11/2016. TL/CMA

## 2016-02-18 NOTE — Telephone Encounter (Signed)
I have informed patient of result. Pt was curious about the next steps for his treatment. I informed patient per last office visit note if patient XR results came back negative then we will order MRI per insurance approval. Pt understood. TL/CMA

## 2016-02-24 ENCOUNTER — Telehealth: Payer: Self-pay | Admitting: Family Medicine

## 2016-02-24 DIAGNOSIS — M25562 Pain in left knee: Secondary | ICD-10-CM

## 2016-02-24 NOTE — Telephone Encounter (Signed)
Spouse Herbert Seta- Heather - [phone]- [phone]  Pt's spouse called in because they received a bill for DOS 01/20/16 from [facility]. Offered the phone number to spouse to [facility], she said that she would just like for someone to look into bill for them. Pt was billed 145.00 for a drug screening. She says that they were told that pt's insurance wouldn't be billed. Please advise further.   Pt is currently taking Adderall. I did advise that random screenings are done when on this medication. She says that they cant afford 145.00 UDS.

## 2016-03-05 NOTE — Addendum Note (Signed)
Addended by: Kandace BlitzLONG, Drevion Offord M on: 03/05/2016 01:46 PM   Modules accepted: Orders

## 2016-03-05 NOTE — Telephone Encounter (Signed)
I don't have a great answer for the UDS.   Please place referral for orthopedic surgery for his knee pain. Ask the patient if he has a particular provider/office he would like to see. Thanks.

## 2016-03-05 NOTE — Telephone Encounter (Signed)
Called patient regarding bill says he has a bill for $1500 and insurance covered all but $115. Patient stated he did not have the money to pay for that bill and is concerned about future bills for his medication.  I told patient I would have someone call him back and go over his expectations around the control substance contracts.   I told patient to call [facility] and inform them that he cannot pay the bill and they should be able to work with him. I did tell him that it our policy to do these screens for certain medications but the clinical staff would go over that in more detail when he gets a call back.   Patient is also upset he never got a update on the MRI of his knee. I informed the patient that the insurance has denied it and mailed him a letter on November 10th. Patient is requesting a call back from provider or CMA on his next steps since his insurance requires additional steps be taken before the MRI.   Please call patient back  [phone]

## 2016-03-05 NOTE — Telephone Encounter (Signed)
[facility] and that has been placed.TL/CMA

## 2016-03-05 NOTE — Telephone Encounter (Signed)
Called patient ensure we handled all his concerns today. Patient said he was satisfied but he is still concerned about the UDS testing. He did not like the idea of having to call [facility] after the testing. I told the patient it would be random testing and not everytime he had a visit.  I told the patient that it was the policy and I would put him a copy in the mail. I told him to read over it and call me if he had any concerns after reading it.  Talked to the provider and he said it would be 1 to 2 random test throughout the year unless there is something showing up in the testing that would require more frequent testing.

## 2016-06-15 ENCOUNTER — Telehealth: Payer: Self-pay | Admitting: Family Medicine

## 2016-06-15 NOTE — Telephone Encounter (Signed)
Requesting:   Adderall Contract    Signed on 12/04/2015 UDS   Done on 01/20/2016-low risk--next is due 07/20/2016 Last OV     02/11/2016----next scheduled appt. Is on 11/03/2016 Last Refill    348 no refills on 03/19/2016  Please Advise

## 2016-06-15 NOTE — Telephone Encounter (Signed)
°  Relation to ZO:XWRUpt:self Call back number:[phone]   Reason for call:  Patient requesting a 3 or 6 month supply of amphetamine-dextroamphetamine (ADDERALL) 20 MG tablet

## 2016-06-16 MED ORDER — AMPHETAMINE-DEXTROAMPHETAMINE 20 MG PO TABS: 20.0000 mg | ORAL_TABLET | Freq: Two times a day (BID) | ORAL | 0 refills | Status: DC

## 2016-06-16 NOTE — Telephone Encounter (Signed)
OK to refill. I think 3 mo supply is the most we can do. TY.

## 2016-06-16 NOTE — Telephone Encounter (Signed)
Called and spoke with the pt and informed him that the prescription has been approved and ready for pick -up.  Informed the pt that the prescription will be placed up front.  Pt verbalized understanding.//AB/CMA

## 2016-09-27 ENCOUNTER — Telehealth: Payer: Self-pay | Admitting: Family Medicine

## 2016-09-27 NOTE — Telephone Encounter (Signed)
Requesting Adderall 20mg -Take 1 tablet by mouth 2 times daily. Last refill:06/16/16;#60,0 Last OV:06/15/16-Appt sch'ed for (11/03/16) UDS:01/20/16-Low risk-Next screening:07/20/16 Please advise.//AB/CMA

## 2016-09-27 NOTE — Telephone Encounter (Signed)
Caller name: Thayer Headingszekiel Mckinstry  Relationship to patient: self Can be reached: [phone]  Reason for call: Pt called in for refill on Adderall. 5 pills left.

## 2016-09-28 NOTE — Telephone Encounter (Signed)
Ok to do. Please do 3 mo. Also, let's get a UDS since it appears he is due. TY.

## 2016-09-29 ENCOUNTER — Telehealth: Payer: Self-pay | Admitting: Family Medicine

## 2016-09-29 MED ORDER — AMPHETAMINE-DEXTROAMPHETAMINE 20 MG PO TABS: 20.0000 mg | ORAL_TABLET | Freq: Two times a day (BID) | ORAL | 0 refills | Status: AC

## 2016-09-29 MED ORDER — AMPHETAMINE-DEXTROAMPHETAMINE 20 MG PO TABS: 20.0000 mg | ORAL_TABLET | Freq: Two times a day (BID) | ORAL | 0 refills | Status: DC

## 2016-09-29 NOTE — Telephone Encounter (Signed)
Called and spoke with the pt and informed him that his prescription for Adderall is ready for pickup.  Informed the pt that he will need to do a UDS.  Pt stated that he had to pay for last UDS.  That his insurance does not cover it.  Asked the pt what insurance does he have and he said [employer].  He said he should not have to pay for it, and I asked if he has spoken to his insurance.  He stated that he has.  Pt then said that he thought the UDS was random not every time.  He stated that he has to do it every time he comes.  Informed the pt that he has not done it every time.  He said that he will find him another doctor.  Pt got a little upset and stated what if his wife comes and picks up the prescriptions, and I informed him that he will need to come because we do need a UDS.  Pt stated that he wife can come she is on his list to pickup his prescriptions.  Informed him again that he will need to come.  Pt upset and stated to forget it.//AB/CMA

## 2016-09-29 NOTE — Telephone Encounter (Signed)
Noted  

## 2016-09-29 NOTE — Telephone Encounter (Signed)
See telephone encounter for (09/27/16).//AB/CMA

## 2016-09-29 NOTE — Telephone Encounter (Signed)
Pt called states requested adderall [phone] with no response. Pt says his pharmacy takes three days to get it in so he needs script asap please. Call pt with resolve.

## 2016-10-07 ENCOUNTER — Encounter: Payer: Self-pay | Admitting: Family Medicine

## 2016-10-11 ENCOUNTER — Encounter: Payer: Self-pay | Admitting: Family Medicine

## 2016-10-21 ENCOUNTER — Telehealth: Payer: Self-pay | Admitting: Family Medicine

## 2016-10-21 NOTE — Telephone Encounter (Signed)
Patient dismissed from [facility] by Dr. Carmelia RollerWendling, effective 10/11/16. Dismissal letter sent out by certified / registered mail. fbg

## 2016-11-03 ENCOUNTER — Ambulatory Visit: Payer: Managed Care, Other (non HMO) | Admitting: Family Medicine

## 2016-11-15 NOTE — Telephone Encounter (Signed)
Certified dismissal letter returned as undeliverable, unclaimed, return to sender after three attempts by [employer] on November 15, 2016 Letter placed in another envelope and resent as 1st class mail which does not require a signature. daj

## 2016-11-15 NOTE — Telephone Encounter (Signed)
No its just letting up know it came back so they will mail 1 class mail. Patient will still be in the dismissal process.

## 2016-11-15 NOTE — Telephone Encounter (Signed)
Do we need to do anything about this? 
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
